# Patient Record
Sex: Female | Born: 1995 | Race: Asian | Hispanic: No | Marital: Single | Smoking: Current every day smoker
Health system: Southern US, Community
[De-identification: ages and names within clinical notes are randomized; demographics above are authoritative.]

---

## 2015-08-26 ENCOUNTER — Emergency Department (HOSPITAL_COMMUNITY)
Admission: EM | Admit: 2015-08-26 | Discharge: 2015-08-26 | Disposition: A | Discharge status: Home / Self Care | Attending: Emergency Medicine | Admitting: Emergency Medicine

## 2015-08-26 ENCOUNTER — Encounter (HOSPITAL_COMMUNITY): Payer: Self-pay | Admitting: Emergency Medicine

## 2015-08-26 ENCOUNTER — Emergency Department (HOSPITAL_COMMUNITY)

## 2015-08-26 DIAGNOSIS — S79911A Unspecified injury of right hip, initial encounter: Secondary | ICD-10-CM | POA: Insufficient documentation

## 2015-08-26 DIAGNOSIS — Z3202 Encounter for pregnancy test, result negative: Secondary | ICD-10-CM | POA: Diagnosis not present

## 2015-08-26 DIAGNOSIS — Y998 Other external cause status: Secondary | ICD-10-CM | POA: Insufficient documentation

## 2015-08-26 DIAGNOSIS — Z720 Tobacco use: Secondary | ICD-10-CM | POA: Insufficient documentation

## 2015-08-26 DIAGNOSIS — Y92410 Unspecified street and highway as the place of occurrence of the external cause: Secondary | ICD-10-CM | POA: Insufficient documentation

## 2015-08-26 DIAGNOSIS — T148 Other injury of unspecified body region: Secondary | ICD-10-CM | POA: Insufficient documentation

## 2015-08-26 DIAGNOSIS — T148XXA Other injury of unspecified body region, initial encounter: Secondary | ICD-10-CM

## 2015-08-26 DIAGNOSIS — S29001A Unspecified injury of muscle and tendon of front wall of thorax, initial encounter: Secondary | ICD-10-CM | POA: Insufficient documentation

## 2015-08-26 DIAGNOSIS — Y9389 Activity, other specified: Secondary | ICD-10-CM | POA: Diagnosis not present

## 2015-08-26 DIAGNOSIS — S59911A Unspecified injury of right forearm, initial encounter: Secondary | ICD-10-CM | POA: Diagnosis present

## 2015-08-26 DIAGNOSIS — S79922A Unspecified injury of left thigh, initial encounter: Secondary | ICD-10-CM | POA: Diagnosis not present

## 2015-08-26 LAB — CBC WITH DIFFERENTIAL/PLATELET
BASOS ABS: 0 10*3/uL (ref 0.0–0.1)
Basophils Relative: 0 %
EOS ABS: 0.2 10*3/uL (ref 0.0–0.7)
EOS PCT: 2 %
HCT: 42.4 % (ref 36.0–46.0)
Hemoglobin: 14.2 g/dL (ref 12.0–15.0)
LYMPHS PCT: 20 %
Lymphs Abs: 2.2 10*3/uL (ref 0.7–4.0)
MCH: 29.7 pg (ref 26.0–34.0)
MCHC: 33.5 g/dL (ref 30.0–36.0)
MCV: 88.7 fL (ref 78.0–100.0)
Monocytes Absolute: 0.8 10*3/uL (ref 0.1–1.0)
Monocytes Relative: 7 %
NEUTROS PCT: 71 %
Neutro Abs: 8 10*3/uL — ABNORMAL HIGH (ref 1.7–7.7)
PLATELETS: 344 10*3/uL (ref 150–400)
RBC: 4.78 MIL/uL (ref 3.87–5.11)
RDW: 12.5 % (ref 11.5–15.5)
WBC: 11.2 10*3/uL — AB (ref 4.0–10.5)

## 2015-08-26 LAB — COMPREHENSIVE METABOLIC PANEL
ALT: 20 U/L (ref 14–54)
AST: 30 U/L (ref 15–41)
Albumin: 4.6 g/dL (ref 3.5–5.0)
Alkaline Phosphatase: 54 U/L (ref 38–126)
Anion gap: 11 (ref 5–15)
BUN: 14 mg/dL (ref 6–20)
CHLORIDE: 109 mmol/L (ref 101–111)
CO2: 22 mmol/L (ref 22–32)
CREATININE: 0.71 mg/dL (ref 0.44–1.00)
Calcium: 10.2 mg/dL (ref 8.9–10.3)
GFR calc Af Amer: >60 mL/min (ref >60)
GFR calc non Af Amer: >60 mL/min (ref >60)
GLUCOSE: 93 mg/dL (ref 65–99)
Potassium: 3.8 mmol/L (ref 3.5–5.1)
SODIUM: 142 mmol/L (ref 135–145)
Total Bilirubin: 0.2 mg/dL — ABNORMAL LOW (ref 0.3–1.2)
Total Protein: 7.8 g/dL (ref 6.5–8.1)

## 2015-08-26 LAB — TROPONIN I: Troponin I: <0.03 ng/mL (ref <0.031)

## 2015-08-26 LAB — POC URINE PREG, ED: Preg Test, Ur: NEGATIVE

## 2015-08-26 MED ORDER — IBUPROFEN 400 MG PO TABS: 400.0000 mg | ORAL_TABLET | Freq: Four times a day (QID) | ORAL | Status: AC | PRN

## 2015-08-26 MED ORDER — TRAMADOL HCL 50 MG PO TABS: 50.0000 mg | ORAL_TABLET | Freq: Four times a day (QID) | ORAL | Status: AC | PRN

## 2015-08-26 MED ORDER — FENTANYL CITRATE (PF) 100 MCG/2ML IJ SOLN
25.0000 ug | Freq: Once | INTRAMUSCULAR | Status: AC
  Administered 2015-08-26: 25 ug via INTRAVENOUS
  Filled 2015-08-26: qty 2
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
## 2016-06-25 ENCOUNTER — Emergency Department (HOSPITAL_COMMUNITY)
Admission: EM | Admit: 2016-06-25 | Discharge: 2016-06-25 | Disposition: A | Discharge status: Home / Self Care | Attending: Emergency Medicine | Admitting: Emergency Medicine

## 2016-06-25 ENCOUNTER — Emergency Department (HOSPITAL_COMMUNITY)

## 2016-06-25 ENCOUNTER — Encounter (HOSPITAL_COMMUNITY): Payer: Self-pay

## 2016-06-25 DIAGNOSIS — Y92410 Unspecified street and highway as the place of occurrence of the external cause: Secondary | ICD-10-CM | POA: Insufficient documentation

## 2016-06-25 DIAGNOSIS — S8001XA Contusion of right knee, initial encounter: Secondary | ICD-10-CM | POA: Insufficient documentation

## 2016-06-25 DIAGNOSIS — Y999 Unspecified external cause status: Secondary | ICD-10-CM | POA: Diagnosis not present

## 2016-06-25 DIAGNOSIS — F17210 Nicotine dependence, cigarettes, uncomplicated: Secondary | ICD-10-CM | POA: Diagnosis not present

## 2016-06-25 DIAGNOSIS — S161XXA Strain of muscle, fascia and tendon at neck level, initial encounter: Secondary | ICD-10-CM | POA: Insufficient documentation

## 2016-06-25 DIAGNOSIS — Y939 Activity, unspecified: Secondary | ICD-10-CM | POA: Diagnosis not present

## 2016-06-25 DIAGNOSIS — S199XXA Unspecified injury of neck, initial encounter: Secondary | ICD-10-CM | POA: Diagnosis present

## 2016-06-25 LAB — POC URINE PREG, ED: PREG TEST UR: NEGATIVE

## 2016-06-25 MED ORDER — IBUPROFEN 200 MG PO TABS
600.0000 mg | ORAL_TABLET | Freq: Once | ORAL | Status: AC
  Administered 2016-06-25: 600 mg via ORAL
  Filled 2016-06-25: qty 1
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
